# Patient Record
Sex: Male | Born: 1975
Health system: Southern US, Community
[De-identification: ages and names within clinical notes are randomized; demographics above are authoritative.]

## PROBLEM LIST (undated history)

## (undated) HISTORY — PX: HAND SURGERY: SHX662

---

## 2013-03-08 ENCOUNTER — Emergency Department (HOSPITAL_BASED_OUTPATIENT_CLINIC_OR_DEPARTMENT_OTHER): Payer: Self-pay

## 2013-03-08 ENCOUNTER — Encounter (HOSPITAL_BASED_OUTPATIENT_CLINIC_OR_DEPARTMENT_OTHER): Payer: Self-pay | Admitting: *Deleted

## 2013-03-08 ENCOUNTER — Emergency Department (HOSPITAL_BASED_OUTPATIENT_CLINIC_OR_DEPARTMENT_OTHER)
Admission: EM | Admit: 2013-03-08 | Discharge: 2013-03-08 | Disposition: A | Discharge status: Home / Self Care | Payer: Self-pay | Attending: Emergency Medicine | Admitting: Emergency Medicine

## 2013-03-08 DIAGNOSIS — R5381 Other malaise: Secondary | ICD-10-CM | POA: Insufficient documentation

## 2013-03-08 DIAGNOSIS — Z79899 Other long term (current) drug therapy: Secondary | ICD-10-CM | POA: Insufficient documentation

## 2013-03-08 DIAGNOSIS — R1110 Vomiting, unspecified: Secondary | ICD-10-CM | POA: Insufficient documentation

## 2013-03-08 DIAGNOSIS — R062 Wheezing: Secondary | ICD-10-CM | POA: Insufficient documentation

## 2013-03-08 DIAGNOSIS — F17200 Nicotine dependence, unspecified, uncomplicated: Secondary | ICD-10-CM | POA: Insufficient documentation

## 2013-03-08 DIAGNOSIS — R509 Fever, unspecified: Secondary | ICD-10-CM | POA: Insufficient documentation

## 2013-03-08 DIAGNOSIS — J209 Acute bronchitis, unspecified: Secondary | ICD-10-CM

## 2013-03-08 DIAGNOSIS — R0602 Shortness of breath: Secondary | ICD-10-CM | POA: Insufficient documentation

## 2013-03-08 DIAGNOSIS — J40 Bronchitis, not specified as acute or chronic: Secondary | ICD-10-CM | POA: Insufficient documentation

## 2013-03-08 DIAGNOSIS — R52 Pain, unspecified: Secondary | ICD-10-CM | POA: Insufficient documentation

## 2013-03-08 MED ORDER — ALBUTEROL SULFATE HFA 108 (90 BASE) MCG/ACT IN AERS: 1.0000 | INHALATION_SPRAY | Freq: Four times a day (QID) | RESPIRATORY_TRACT | Status: AC | PRN

## 2013-03-08 MED ORDER — BENZONATATE 100 MG PO CAPS: 100.0000 mg | ORAL_CAPSULE | Freq: Three times a day (TID) | ORAL | Status: AC

## 2013-03-08 MED ORDER — PREDNISONE 50 MG PO TABS: 50.0000 mg | ORAL_TABLET | Freq: Every day | ORAL | Status: AC

## 2013-03-08 MED ORDER — ALBUTEROL SULFATE (5 MG/ML) 0.5% IN NEBU
5.0000 mg | INHALATION_SOLUTION | Freq: Once | RESPIRATORY_TRACT | Status: AC
  Administered 2013-03-08: 5 mg via RESPIRATORY_TRACT
  Filled 2013-03-08: qty 1

## 2013-03-08 MED ORDER — METHYLPREDNISOLONE SODIUM SUCC 125 MG IJ SOLR
125.0000 mg | Freq: Once | INTRAMUSCULAR | Status: AC
  Administered 2013-03-08: 125 mg via INTRAVENOUS
  Filled 2013-03-08: qty 2

## 2013-03-08 NOTE — ED Provider Notes (Signed)
History     CSN: [id number]  Arrival date & time 03/08/13  1028   First MD Initiated Contact with Patient 03/08/13 1108      Chief Complaint  Patient presents with  . Cough  . Generalized Body Aches    (Consider location/radiation/quality/duration/timing/severity/associated sxs/prior treatment) HPI Pt with 3 days of cough, myalgias, chills, fever and post-tussive emesis. No diarrhea or abd pain.  History reviewed. No pertinent past medical history.  History reviewed. No pertinent past surgical history.  History reviewed. No pertinent family history.  History  Substance Use Topics  . Smoking status: Current Every Day Smoker  . Smokeless tobacco: Not on file  . Alcohol Use: Yes      Review of Systems  Constitutional: Positive for fever, chills and fatigue.  HENT: Negative for sore throat, neck pain and neck stiffness.   Respiratory: Positive for cough, shortness of breath and wheezing.   Cardiovascular: Negative for chest pain.  Gastrointestinal: Positive for vomiting. Negative for nausea and abdominal pain.  Skin: Negative for rash.  All other systems reviewed and are negative.    Allergies  Review of patient's allergies indicates no known allergies.  Home Medications   Current Outpatient Rx  Name  Route  Sig  Dispense  Refill  . albuterol (PROVENTIL HFA;VENTOLIN HFA) 108 (90 BASE) MCG/ACT inhaler   Inhalation   Inhale 1-2 puffs into the lungs every 6 (six) hours as needed for wheezing.   1 Inhaler   0   . benzonatate (TESSALON) 100 MG capsule   Oral   Take 1 capsule (100 mg total) by mouth every 8 (eight) hours.   21 capsule   0   . predniSONE (DELTASONE) 50 MG tablet   Oral   Take 1 tablet (50 mg total) by mouth daily.   5 tablet   0     BP 133/98  Pulse 88  Temp(Src) 98.7 F (37.1 C) (Oral)  Resp 16  Ht 6\' 1"  (1.854 m)  Wt 180 lb (81.647 kg)  BMI 23.75 kg/m2  SpO2 100%  Physical Exam  Nursing note and vitals  reviewed. Constitutional: He is oriented to person, place, and time. He appears well-developed and well-nourished. No distress.  HENT:  Head: Normocephalic and atraumatic.  Mouth/Throat: Oropharynx is clear and moist. No oropharyngeal exudate.  Eyes: EOM are normal. Pupils are equal, round, and reactive to light.  Neck: Normal range of motion. Neck supple.  Cardiovascular: Normal rate and regular rhythm.   Pulmonary/Chest: Effort normal. No respiratory distress. He has wheezes. He has no rales.  Abdominal: Soft. Bowel sounds are normal. He exhibits no distension and no mass. There is no tenderness. There is no rebound and no guarding.  Musculoskeletal: Normal range of motion. He exhibits no edema and no tenderness.  Lymphadenopathy:    He has no cervical adenopathy.  Neurological: He is alert and oriented to person, place, and time.  Skin: Skin is warm and dry. No rash noted. No erythema.  Psychiatric: He has a normal mood and affect. His behavior is normal.    ED Course  Procedures (including critical care time)  Labs Reviewed - No data to display Dg Chest 2 View  03/08/2013  *RADIOLOGY REPORT*  Clinical Data: Cough, chills and fever.  CHEST - 2 VIEW  Comparison: None.  Findings: Lungs are clear.  Heart size is normal.  No pneumothorax or pleural effusion.  IMPRESSION: Negative chest.   Original Report Authenticated By: Holley Dexter, M.D.  1. Bronchitis with bronchospasm       MDM  Pt feeling much better after nebs. Lungs clear. Return precautions given        Loren Racer, MD 03/08/13 1501

## 2013-03-08 NOTE — ED Notes (Signed)
Pt states Sunday he started having chills and body aches with off and on vomiting since then also reports cough

## 2016-05-19 ENCOUNTER — Encounter (HOSPITAL_BASED_OUTPATIENT_CLINIC_OR_DEPARTMENT_OTHER): Payer: Self-pay | Admitting: *Deleted

## 2016-05-19 ENCOUNTER — Emergency Department (HOSPITAL_BASED_OUTPATIENT_CLINIC_OR_DEPARTMENT_OTHER)
Admission: EM | Admit: 2016-05-19 | Discharge: 2016-05-19 | Disposition: A | Discharge status: Home / Self Care | Payer: No Typology Code available for payment source | Attending: Emergency Medicine | Admitting: Emergency Medicine

## 2016-05-19 ENCOUNTER — Emergency Department (HOSPITAL_BASED_OUTPATIENT_CLINIC_OR_DEPARTMENT_OTHER): Payer: No Typology Code available for payment source

## 2016-05-19 DIAGNOSIS — R0781 Pleurodynia: Secondary | ICD-10-CM | POA: Insufficient documentation

## 2016-05-19 DIAGNOSIS — F17200 Nicotine dependence, unspecified, uncomplicated: Secondary | ICD-10-CM | POA: Diagnosis not present

## 2016-05-19 DIAGNOSIS — Y939 Activity, unspecified: Secondary | ICD-10-CM | POA: Diagnosis not present

## 2016-05-19 DIAGNOSIS — Y92410 Unspecified street and highway as the place of occurrence of the external cause: Secondary | ICD-10-CM | POA: Insufficient documentation

## 2016-05-19 DIAGNOSIS — Y999 Unspecified external cause status: Secondary | ICD-10-CM | POA: Diagnosis not present

## 2016-05-19 MED ORDER — CYCLOBENZAPRINE HCL 10 MG PO TABS
10.0000 mg | ORAL_TABLET | Freq: Once | ORAL | Status: AC
  Administered 2016-05-19: 10 mg via ORAL
  Filled 2016-05-19: qty 1

## 2016-05-19 MED ORDER — CYCLOBENZAPRINE HCL 10 MG PO TABS: 10.0000 mg | ORAL_TABLET | Freq: Three times a day (TID) | ORAL | Status: AC | PRN
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
## 2016-05-19 NOTE — Discharge Instructions (Signed)

## 2016-05-19 NOTE — ED Notes (Signed)
Pt in radiology at present
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
## 2020-05-13 ENCOUNTER — Other Ambulatory Visit: Payer: Self-pay

## 2020-05-13 ENCOUNTER — Emergency Department (HOSPITAL_BASED_OUTPATIENT_CLINIC_OR_DEPARTMENT_OTHER)
Admission: EM | Admit: 2020-05-13 | Discharge: 2020-05-13 | Disposition: A | Discharge status: Home / Self Care | Payer: Self-pay | Attending: Emergency Medicine | Admitting: Emergency Medicine

## 2020-05-13 ENCOUNTER — Encounter (HOSPITAL_BASED_OUTPATIENT_CLINIC_OR_DEPARTMENT_OTHER): Payer: Self-pay | Admitting: *Deleted

## 2020-05-13 DIAGNOSIS — Y9389 Activity, other specified: Secondary | ICD-10-CM | POA: Insufficient documentation

## 2020-05-13 DIAGNOSIS — Y9289 Other specified places as the place of occurrence of the external cause: Secondary | ICD-10-CM | POA: Insufficient documentation

## 2020-05-13 DIAGNOSIS — S161XXA Strain of muscle, fascia and tendon at neck level, initial encounter: Secondary | ICD-10-CM | POA: Insufficient documentation

## 2020-05-13 DIAGNOSIS — Z87891 Personal history of nicotine dependence: Secondary | ICD-10-CM | POA: Insufficient documentation

## 2020-05-13 DIAGNOSIS — Y999 Unspecified external cause status: Secondary | ICD-10-CM | POA: Insufficient documentation

## 2020-05-13 MED ORDER — HYDROCODONE-ACETAMINOPHEN 5-325 MG PO TABS: 1.0000 | ORAL_TABLET | ORAL | 0 refills | Status: AC | PRN

## 2020-05-13 MED ORDER — IBUPROFEN 600 MG PO TABS: 600.0000 mg | ORAL_TABLET | Freq: Four times a day (QID) | ORAL | 0 refills | Status: DC | PRN

## 2020-05-13 MED ORDER — IBUPROFEN 800 MG PO TABS
800.0000 mg | ORAL_TABLET | Freq: Once | ORAL | Status: AC
  Administered 2020-05-13: 800 mg via ORAL
  Filled 2020-05-13: qty 1

## 2020-05-13 MED FILL — IBUPROFEN 600 MG TABLET: 600 | 7 days supply | Qty: 30 | Fill #0

## 2020-05-13 MED FILL — HYDROCODON-APAP 5-325: 5-325 | 2 days supply | Qty: 10 | Fill #0
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
## 2021-09-15 ENCOUNTER — Emergency Department (HOSPITAL_BASED_OUTPATIENT_CLINIC_OR_DEPARTMENT_OTHER): Payer: Self-pay

## 2021-09-15 ENCOUNTER — Emergency Department (HOSPITAL_BASED_OUTPATIENT_CLINIC_OR_DEPARTMENT_OTHER)
Admission: EM | Admit: 2021-09-15 | Discharge: 2021-09-15 | Disposition: A | Discharge status: Home / Self Care | Payer: Self-pay | Attending: Emergency Medicine | Admitting: Emergency Medicine

## 2021-09-15 ENCOUNTER — Other Ambulatory Visit: Payer: Self-pay

## 2021-09-15 ENCOUNTER — Encounter (HOSPITAL_BASED_OUTPATIENT_CLINIC_OR_DEPARTMENT_OTHER): Payer: Self-pay | Admitting: *Deleted

## 2021-09-15 DIAGNOSIS — Z87891 Personal history of nicotine dependence: Secondary | ICD-10-CM | POA: Insufficient documentation

## 2021-09-15 DIAGNOSIS — Z23 Encounter for immunization: Secondary | ICD-10-CM | POA: Insufficient documentation

## 2021-09-15 DIAGNOSIS — S61431A Puncture wound without foreign body of right hand, initial encounter: Secondary | ICD-10-CM

## 2021-09-15 DIAGNOSIS — W298XXA Contact with other powered powered hand tools and household machinery, initial encounter: Secondary | ICD-10-CM | POA: Insufficient documentation

## 2021-09-15 DIAGNOSIS — Y9389 Activity, other specified: Secondary | ICD-10-CM | POA: Insufficient documentation

## 2021-09-15 MED ORDER — CEPHALEXIN 250 MG PO CAPS
500.0000 mg | ORAL_CAPSULE | Freq: Once | ORAL | Status: AC
  Administered 2021-09-15: 500 mg via ORAL
  Filled 2021-09-15: qty 2

## 2021-09-15 MED ORDER — TETANUS-DIPHTH-ACELL PERTUSSIS 5-2.5-18.5 LF-MCG/0.5 IM SUSY
0.5000 mL | PREFILLED_SYRINGE | Freq: Once | INTRAMUSCULAR | Status: AC
  Administered 2021-09-15: 0.5 mL via INTRAMUSCULAR
  Filled 2021-09-15: qty 0.5

## 2021-09-15 MED ORDER — CEPHALEXIN 500 MG PO CAPS
500.0000 mg | ORAL_CAPSULE | Freq: Four times a day (QID) | ORAL | 0 refills | Status: AC
Start: 2021-09-15 — End: ?

## 2021-09-15 MED ORDER — IBUPROFEN 800 MG PO TABS: 800.0000 mg | ORAL_TABLET | Freq: Three times a day (TID) | ORAL | 0 refills | Status: AC | PRN

## 2021-09-15 MED ORDER — IBUPROFEN 400 MG PO TABS
600.0000 mg | ORAL_TABLET | Freq: Once | ORAL | Status: AC
  Administered 2021-09-15: 600 mg via ORAL
  Filled 2021-09-15: qty 1

## 2021-09-15 NOTE — Discharge Instructions (Signed)
You are seen in the emerged part for a puncture wound to your right hand.  Your x-ray did not show any obvious fracture or foreign body.  Please soak your hand in warm salt water twice a day.  Watch for signs of infection.  Finish your antibiotic.  Call hand surgery for follow-up if any worsening or concerning symptoms.
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
## 2021-09-15 NOTE — ED Provider Notes (Signed)
[facility] EMERGENCY DEPARTMENT Provider Note   CSN: [id number] Arrival date & time: 09/15/21  1926     History Chief Complaint  Patient presents with   Hand Pain    Aaron [name] is a 45 y.o. male.  He is here with a puncture wound to his right hand.  He was using a power drill drilling wood when it slipped and went into his right hand between the thumb and the second digit, dorsum of his hand.  Complaining of pain and swelling.  No numbness or weakness.  Tetanus is up-to-date.  The history is provided by the patient.  Hand Pain This is a new problem. The current episode started 1 to 2 hours ago. The problem occurs constantly. The problem has not changed since onset.Pertinent negatives include no chest pain, no abdominal pain, no headaches and no shortness of breath. The symptoms are aggravated by bending and twisting. Nothing relieves the symptoms. He has tried nothing for the symptoms. The treatment provided no relief.      History reviewed. No pertinent past medical history.  There are no problems to display for this patient.   Past Surgical History:  Procedure Laterality Date   HAND SURGERY         History reviewed. No pertinent family history.  Social History   Tobacco Use   Smoking status: Former   Smokeless tobacco: Never  Substance Use Topics   Alcohol use: Yes   Drug use: Yes    Types: Marijuana    Home Medications Prior to Admission medications   Medication Sig Start Date End Date Taking? Authorizing Provider  albuterol (PROVENTIL HFA;VENTOLIN HFA) 108 (90 BASE) MCG/ACT inhaler Inhale 1-2 puffs into the lungs every 6 (six) hours as needed for wheezing. 03/08/13   Loren Racer, MD  benzonatate (TESSALON) 100 MG capsule Take 1 capsule (100 mg total) by mouth every 8 (eight) hours. 03/08/13   Loren Racer, MD  cyclobenzaprine (FLEXERIL) 10 MG tablet Take 1 tablet (10 mg total) by mouth 3 (three) times daily as needed for muscle spasms.  05/19/16   Alvira Monday, MD  HYDROcodone-acetaminophen (NORCO/VICODIN) 5-325 MG tablet Take 1 tablet by mouth every 4 (four) hours as needed. 05/13/20   Jacalyn Lefevre, MD  ibuprofen (ADVIL) 600 MG tablet Take 1 tablet (600 mg total) by mouth every 6 (six) hours as needed. 05/13/20   Jacalyn Lefevre, MD  predniSONE (DELTASONE) 50 MG tablet Take 1 tablet (50 mg total) by mouth daily. 03/08/13   Loren Racer, MD    Allergies    Patient has no known allergies.  Review of Systems   Review of Systems  Constitutional:  Negative for fever.  Respiratory:  Negative for shortness of breath.   Cardiovascular:  Negative for chest pain.  Gastrointestinal:  Negative for abdominal pain.  Skin:  Positive for wound.  Neurological:  Negative for weakness, numbness and headaches.   Physical Exam Updated Vital Signs BP (!) 148/98 (BP Location: Left Arm)   Pulse 75   Temp 98.3 F (36.8 C) (Oral)   Resp 18   Ht 6\' 1"  (1.854 m)   Wt 74.8 kg   SpO2 98%   BMI 21.77 kg/m   Physical Exam Vitals and nursing note reviewed.  Constitutional:      Appearance: Normal appearance. He is well-developed.  HENT:     Head: Normocephalic and atraumatic.  Eyes:     Conjunctiva/sclera: Conjunctivae normal.  Pulmonary:     Effort: Pulmonary effort is  normal.  Musculoskeletal:        General: Tenderness and signs of injury present. Normal range of motion.     Cervical back: Neck supple.     Comments: Right hand has puncture wound in the webspace between the first and second digit.  There is no active bleeding.  Compartment is soft.  No distal numbness or weakness.  Cap refill brisk  Skin:    General: Skin is warm and dry.  Neurological:     Mental Status: He is alert.     GCS: GCS eye subscore is 4. GCS verbal subscore is 5. GCS motor subscore is 6.     Sensory: No sensory deficit.     Motor: No weakness.    ED Results / Procedures / Treatments   Labs (all labs ordered are listed, but only abnormal  results are displayed) Labs Reviewed - No data to display  EKG None  Radiology DG Hand Complete Right  Result Date: 09/15/2021 CLINICAL DATA:  Skin puncture with a drill between the first and second digits, initial encounter EXAM: RIGHT HAND - COMPLETE 3+ VIEW COMPARISON:  None. FINDINGS: No acute radiopaque foreign body is noted. Old healed fifth metacarpal fracture is seen. No acute fracture or dislocation is noted. Mild soft tissue swelling is noted in the area of clinical concern. IMPRESSION: Soft tissue injury without acute bony abnormality. No foreign body is seen. Electronically Signed   By: Alcide Clever M.D.   On: 09/15/2021 20:20    Procedures Procedures   Medications Ordered in ED Medications - No data to display  ED Course  I have reviewed the triage vital signs and the nursing notes.  Pertinent labs & imaging results that were available during my care of the patient were reviewed by me and considered in my medical decision making (see chart for details).  Clinical Course as of 09/16/21 1906  Mon Sep 15, 2021  2155 patient had hand irrigated and dressed. [MB]    Clinical Course User Index [MB] Terrilee Files, MD   MDM Rules/Calculators/A&P                          Right hand wound status post puncture injury with drill.  No evidence of compartment syndrome or significant vascular compromise.  X-rays ordered and interpreted by me as no acute fracture no foreign body identified.  Patient had wound irrigated and cleansed.  Dressing applied.  Will cover with antibiotics for deep space puncture.  Given contact information for hand surgery.  Tetanus updated.  Return instructions discussed  Final Clinical Impression(s) / ED Diagnoses Final diagnoses:  Puncture wound of right hand without foreign body, initial encounter    Rx / DC Orders ED Discharge Orders          Ordered    cephALEXin (KEFLEX) 500 MG capsule  4 times daily        09/15/21 2158    ibuprofen  (ADVIL) 800 MG tablet  Every 8 hours PRN        09/15/21 2158             Terrilee Files, MD 09/16/21 1907

## 2022-01-24 IMAGING — DX DG HAND COMPLETE 3+V*R*
3 series · 3 of 3 positions shown · non-contrast
Comparison: None.

CLINICAL DATA: Skin puncture with a drill between the first and
second digits, initial encounter

EXAM:
RIGHT HAND - COMPLETE 3+ VIEW

[hand pa]
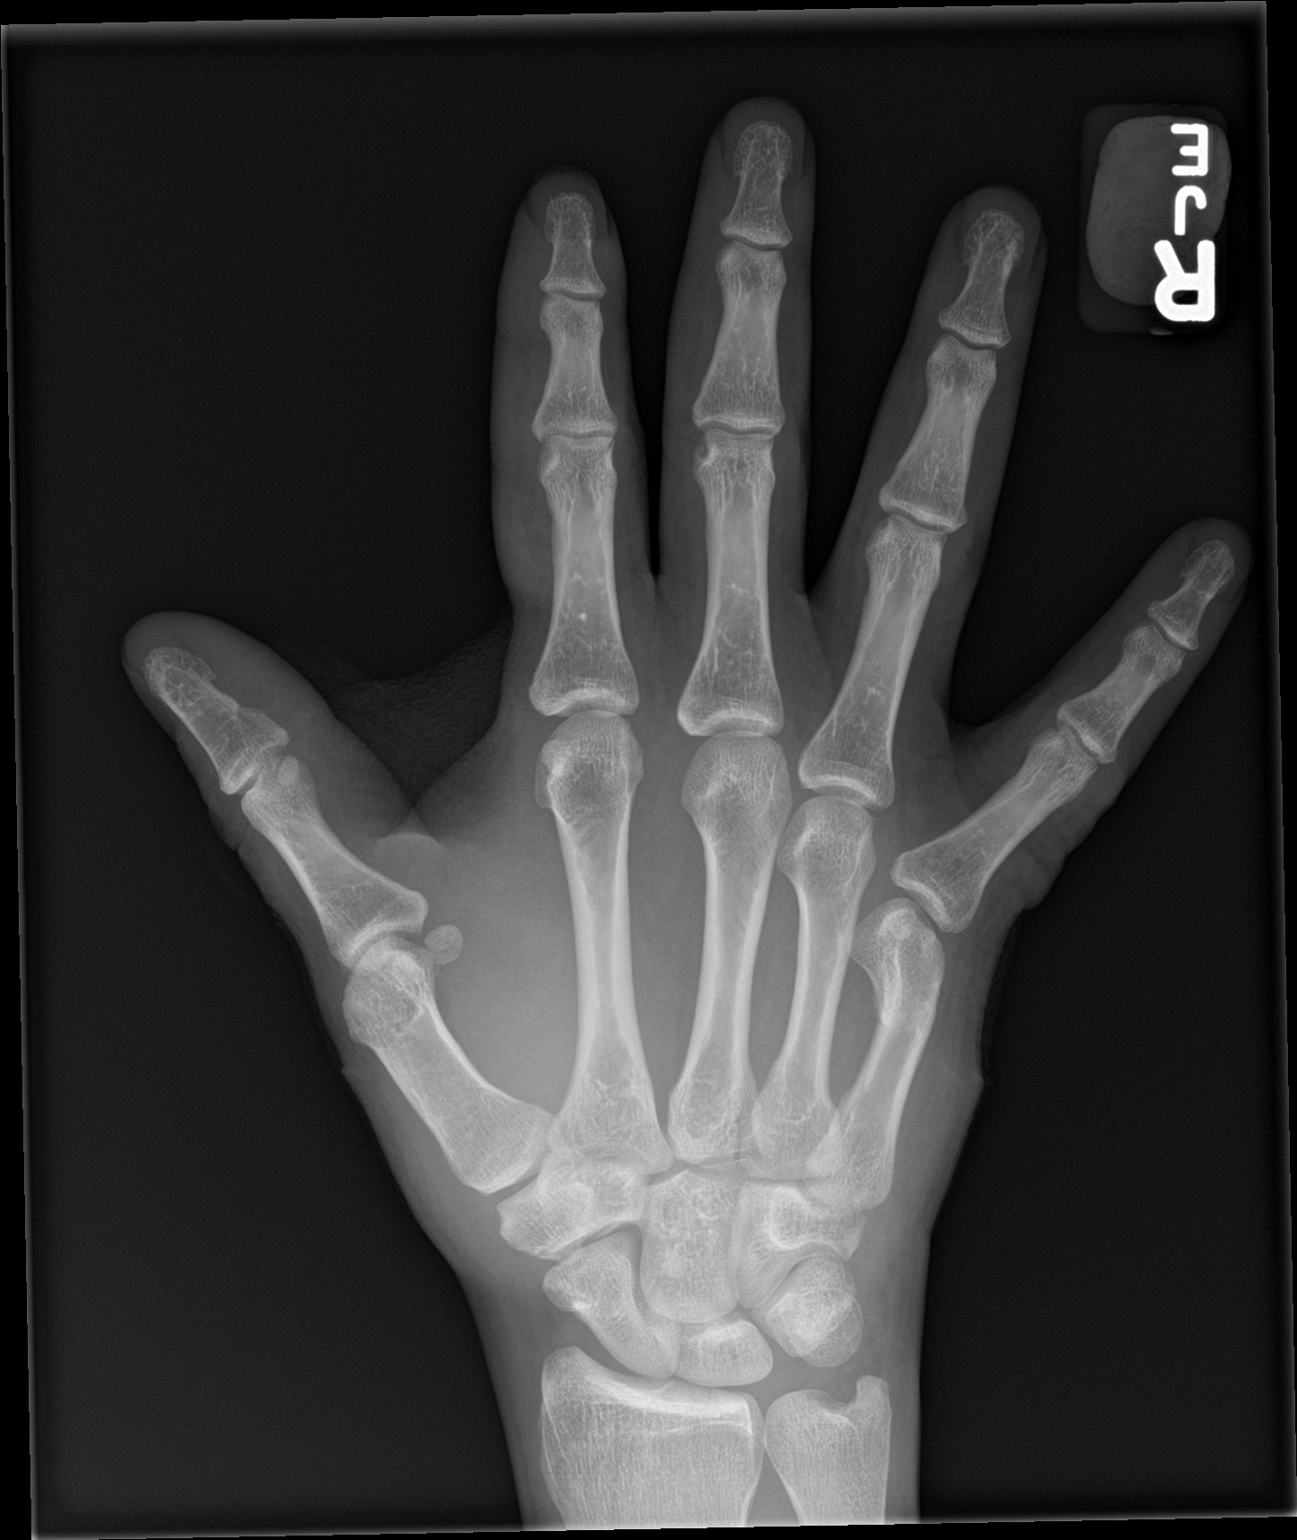

[hand obl]
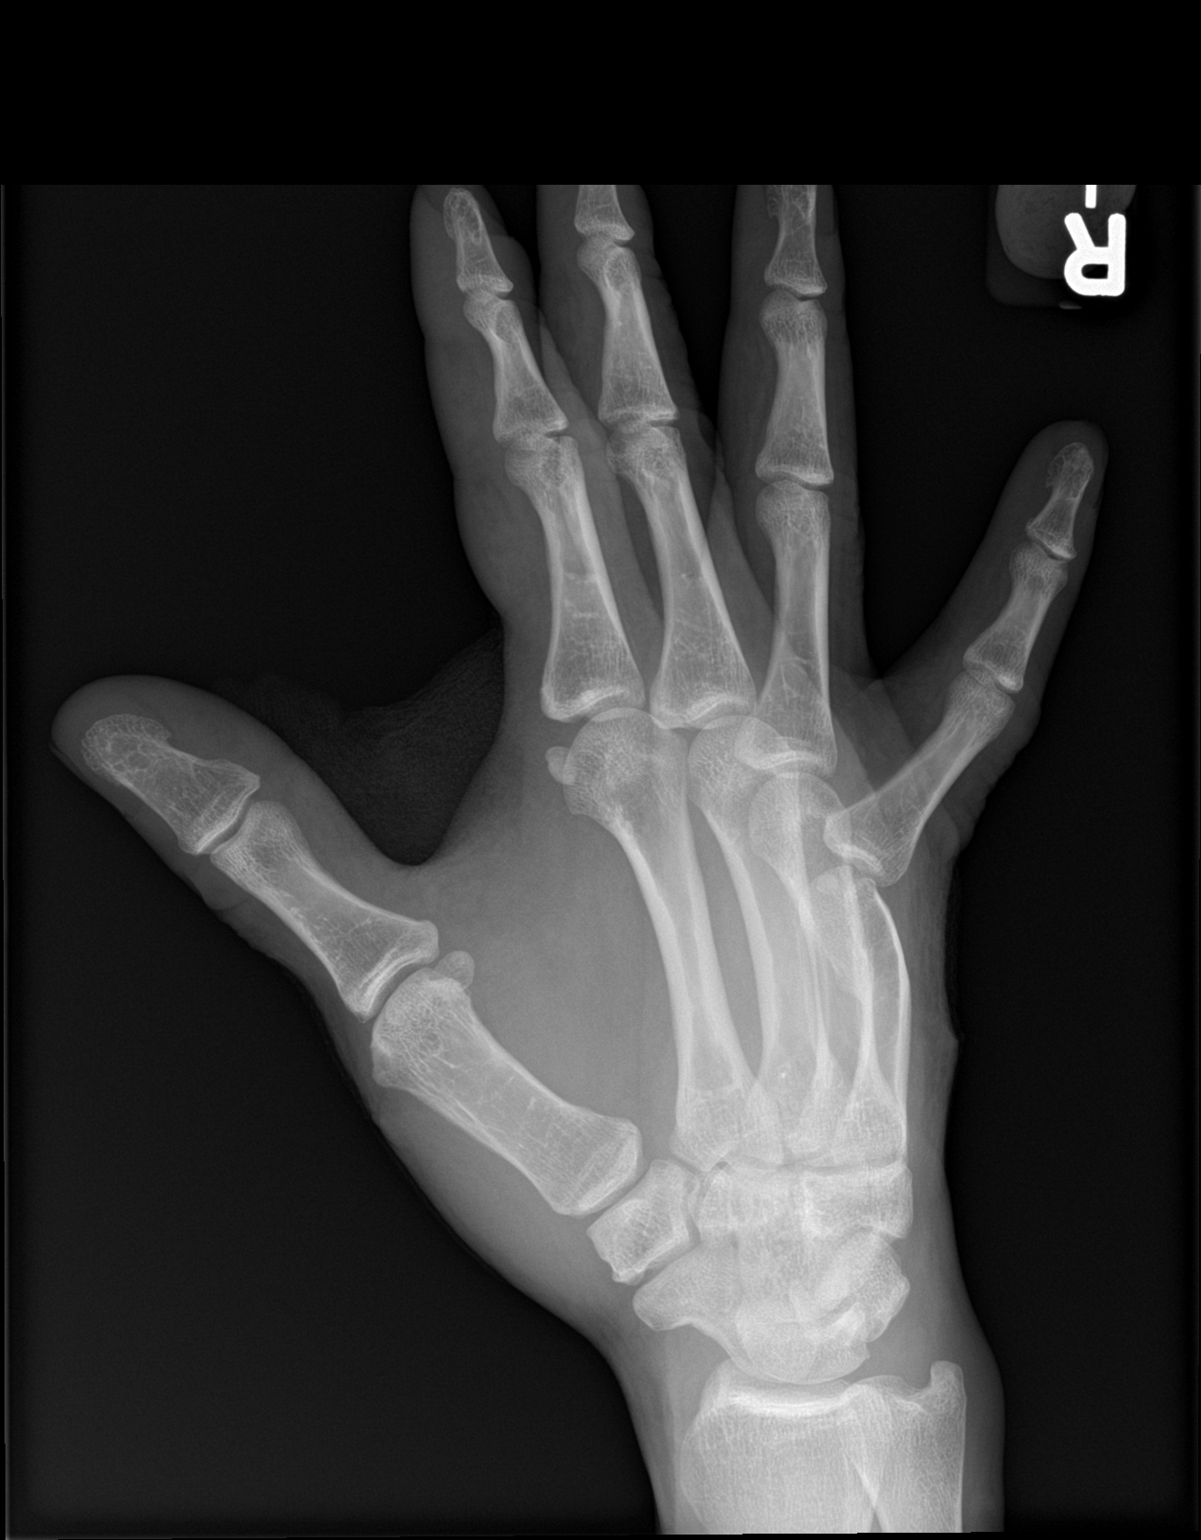

[hand lat]
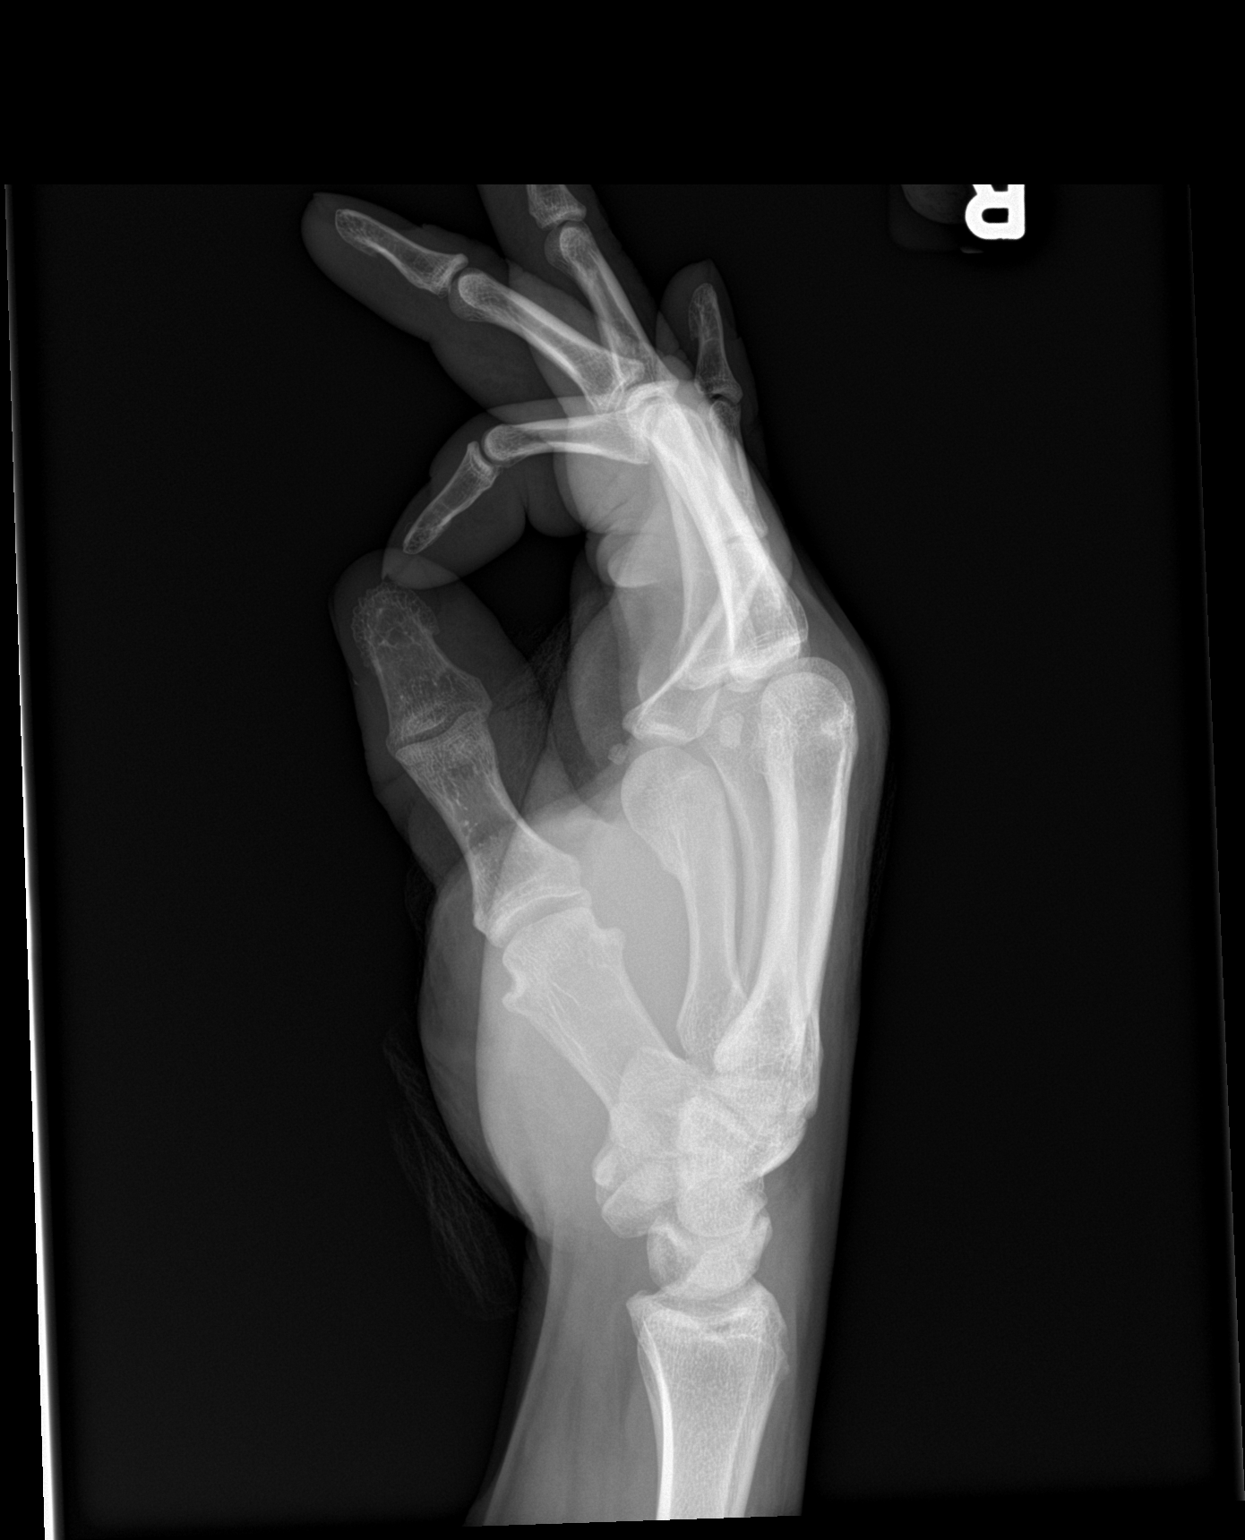

[3 of 3 positions shown; findings below may reference images not displayed]

FINDINGS: No acute radiopaque foreign body is noted. Old healed fifth
metacarpal fracture is seen. No acute fracture or dislocation is
noted. Mild soft tissue swelling is noted in the area of clinical
concern.
IMPRESSION: Soft tissue injury without acute bony abnormality. No foreign body
is seen.

## 2023-01-28 ENCOUNTER — Emergency Department (HOSPITAL_BASED_OUTPATIENT_CLINIC_OR_DEPARTMENT_OTHER): Payer: Self-pay

## 2023-01-28 ENCOUNTER — Other Ambulatory Visit: Payer: Self-pay

## 2023-01-28 ENCOUNTER — Emergency Department (HOSPITAL_BASED_OUTPATIENT_CLINIC_OR_DEPARTMENT_OTHER)
Admission: EM | Admit: 2023-01-28 | Discharge: 2023-01-28 | Disposition: A | Discharge status: Home / Self Care | Payer: Self-pay | Attending: Emergency Medicine | Admitting: Emergency Medicine

## 2023-01-28 ENCOUNTER — Encounter (HOSPITAL_BASED_OUTPATIENT_CLINIC_OR_DEPARTMENT_OTHER): Payer: Self-pay

## 2023-01-28 DIAGNOSIS — W268XXA Contact with other sharp object(s), not elsewhere classified, initial encounter: Secondary | ICD-10-CM | POA: Insufficient documentation

## 2023-01-28 DIAGNOSIS — S61216A Laceration without foreign body of right little finger without damage to nail, initial encounter: Secondary | ICD-10-CM | POA: Insufficient documentation

## 2023-01-28 MED ORDER — CEPHALEXIN 500 MG PO CAPS: 500.0000 mg | ORAL_CAPSULE | Freq: Four times a day (QID) | ORAL | 0 refills | Status: AC

## 2023-01-28 NOTE — Discharge Instructions (Addendum)
It was a pleasure taking care of you today. As discussed, I am sending you home with an antibiotic. Take as prescribed and finish all antibiotics to prevent infection. Keep wound clean. Follow-up with PCP if symptoms do not improve over the next week. Return to the ER for new or worsening symptoms.

## 2023-01-28 NOTE — ED Notes (Signed)
Pt discharge instructions and medications reviewed. States understanding. Work noted provided

## 2023-01-28 NOTE — ED Provider Notes (Signed)
Aaron [name]   CSN: [id number] Arrival date & time: 01/28/23  1633     History  Chief Complaint  Patient presents with   Extremity Laceration    Aaron [name] is a 47 y.o. male with no significant past medical history who presents to the ED due to laceration to right pinky finger that occurred 24 hours ago.  Patient states he cut his finger with a box cutter.  Last tetanus was 2022 per chart review.  Denies numbness/tingling.  Patient is left-hand dominant. No other injuries.   History obtained from patient and past medical records. No interpreter used during encounter.       Home Medications Prior to Admission medications   Medication Sig Start Date End Date Taking? Authorizing Provider  cephALEXin (KEFLEX) 500 MG capsule Take 1 capsule (500 mg total) by mouth 4 (four) times daily. 01/28/23  Yes Sherian Valenza C, PA-C  albuterol (PROVENTIL HFA;VENTOLIN HFA) 108 (90 BASE) MCG/ACT inhaler Inhale 1-2 puffs into the lungs every 6 (six) hours as needed for wheezing. 03/08/13   Julianne Rice, MD  benzonatate (TESSALON) 100 MG capsule Take 1 capsule (100 mg total) by mouth every 8 (eight) hours. 03/08/13   Julianne Rice, MD  cephALEXin (KEFLEX) 500 MG capsule Take 1 capsule (500 mg total) by mouth 4 (four) times daily. 09/15/21   Hayden Rasmussen, MD  cyclobenzaprine (FLEXERIL) 10 MG tablet Take 1 tablet (10 mg total) by mouth 3 (three) times daily as needed for muscle spasms. 05/19/16   Gareth Morgan, MD  HYDROcodone-acetaminophen (NORCO/VICODIN) 5-325 MG tablet Take 1 tablet by mouth every 4 (four) hours as needed. 05/13/20   Isla Pence, MD  ibuprofen (ADVIL) 800 MG tablet Take 1 tablet (800 mg total) by mouth every 8 (eight) hours as needed. 09/15/21   Hayden Rasmussen, MD  predniSONE (DELTASONE) 50 MG tablet Take 1 tablet (50 mg total) by mouth daily. 03/08/13   Julianne Rice, MD      Allergies    Patient has no  known allergies.    Review of Systems   Review of Systems  Skin:  Positive for wound.  Neurological:  Negative for numbness.  All other systems reviewed and are negative.   Physical Exam Updated Vital Signs BP 126/80 (BP Location: Right Arm)   Pulse 66   Temp 98.2 F (36.8 C)   Resp 18   Ht 6\' 1"  (1.854 m)   Wt 81.6 kg   SpO2 98%   BMI 23.75 kg/m  Physical Exam Vitals and nursing [name] reviewed.  Constitutional:      General: He is not in acute distress.    Appearance: He is not ill-appearing.  HENT:     Head: Normocephalic.  Eyes:     Pupils: Pupils are equal, round, and reactive to light.  Cardiovascular:     Rate and Rhythm: Normal rate and regular rhythm.     Pulses: Normal pulses.     Heart sounds: Normal heart sounds. No murmur heard.    No friction rub. No gallop.  Pulmonary:     Effort: Pulmonary effort is normal.     Breath sounds: Normal breath sounds.  Abdominal:     General: Abdomen is flat. There is no distension.     Palpations: Abdomen is soft.     Tenderness: There is no abdominal tenderness. There is no guarding or rebound.  Musculoskeletal:        General: Normal  range of motion.     Cervical back: Neck supple.  Skin:    General: Skin is warm and dry.     Comments: 1cm laceration on palmar aspect of right 5th finger  Neurological:     General: No focal deficit present.     Mental Status: He is alert.  Psychiatric:        Mood and Affect: Mood normal.        Behavior: Behavior normal.     ED Results / Procedures / Treatments   Labs (all labs ordered are listed, but only abnormal results are displayed) Labs Reviewed - No data to display  EKG None  Radiology DG Finger Little Right  Result Date: 01/28/2023 CLINICAL DATA:  Straight razor laceration of the small finger last night along finger tip. EXAM: RIGHT LITTLE FINGER 2+V COMPARISON:  Right hand radiographs dated 09/15/2021 FINDINGS: Bandaging observed. Old healed boxer's fracture of  the fifth metacarpal. No fracture or unexpected foreign body. There is potentially some soft tissue swelling along the volar finger. IMPRESSION: 1. No acute fracture or unexpected foreign body. Potential soft tissue swelling along the volar small finger. 2. Old healed Boxer's fracture of the fifth metacarpal. Electronically Signed   By: Van Clines M.D.   On: 01/28/2023 17:41    Procedures Procedures    Medications Ordered in ED Medications - No data to display  ED Course/ Medical Decision Making/ A&P                             Medical Decision Making Amount and/or Complexity of Data Reviewed Radiology: ordered and independent interpretation performed. Decision-making details documented in ED Course.   47 year old male presents to the ED due to laceration to right fifth finger that occurred 24 hours ago.  Patient unintentionally cut his finger with a box cutter.  Last tetanus was 2022 per chart review.  Patient is left-hand dominant.  Superficial 1 cm laceration to palmar aspect of right fifth finger. Laceration does not go through entire finger. No nailbed involvement. X-ray ordered at triage which I personally reviewed and interpreted which is negative for any underlying bony fractures.  Does demonstrate soft tissue swelling from laceration.  Shared decision making in regards to closure versus secondary healing. Discussed risk of infection with sutures given laceration occurred 24 hours ago. Patient prefers to hold off on sutures at this time which I find to be reasonable given laceration is superficial.  Hemostasis achieved.  Wound thoroughly cleaned here in the ED.  Good capillary refill.  Bandage placed over wound.  Advised patient to keep wound clean. Strict ED precautions discussed with patient. Patient states understanding and agrees to plan. Patient discharged home in no acute distress and stable vitals  No PCP       Final Clinical Impression(s) / ED Diagnoses Final  diagnoses:  Laceration of right little finger without foreign body without damage to nail, initial encounter    Rx / DC Orders ED Discharge Orders          Ordered    cephALEXin (KEFLEX) 500 MG capsule  4 times daily        01/28/23 1802              Karie Kirks 01/28/23 1804    Fredia Sorrow, MD 01/30/23 2007

## 2023-01-28 NOTE — ED Triage Notes (Signed)
Last night cut 5th finger with a straight razor. Laceration to finger tip, no bleeding in triage, bandage applied. Unknown last tetanus.

## 2023-01-28 NOTE — ED Notes (Signed)
Right index finger soaked in betadine. Area cleaned . bacitracin applied and wrapped
